# Patient Record
Sex: Male | Born: 1990 | Hispanic: No | Marital: Single | Smoking: Never smoker
Health system: Southern US, Community
[De-identification: ages and names within clinical notes are randomized; demographics above are authoritative.]

---

## 2011-06-15 ENCOUNTER — Inpatient Hospital Stay (HOSPITAL_COMMUNITY)
Admission: EM | Admit: 2011-06-15 | Discharge: 2011-06-17 | DRG: 343 | Disposition: A | Discharge status: Home / Self Care | Payer: Self-pay | Attending: Surgery | Admitting: Surgery

## 2011-06-15 ENCOUNTER — Encounter (HOSPITAL_COMMUNITY): Payer: Self-pay | Admitting: *Deleted

## 2011-06-15 ENCOUNTER — Ambulatory Visit: Payer: Self-pay | Admitting: Emergency Medicine

## 2011-06-15 VITALS — BP 158/110 | HR 82 | Temp 98.3°F | Resp 18 | Ht 67.5 in | Wt 176.6 lb

## 2011-06-15 DIAGNOSIS — M25519 Pain in unspecified shoulder: Secondary | ICD-10-CM | POA: Diagnosis present

## 2011-06-15 DIAGNOSIS — R1032 Left lower quadrant pain: Secondary | ICD-10-CM | POA: Diagnosis present

## 2011-06-15 DIAGNOSIS — K3580 Unspecified acute appendicitis: Principal | ICD-10-CM

## 2011-06-15 DIAGNOSIS — R1011 Right upper quadrant pain: Secondary | ICD-10-CM

## 2011-06-15 LAB — DIFFERENTIAL
Basophils Absolute: 0 10*3/uL (ref 0.0–0.1)
Basophils Relative: 0 % (ref 0–1)
Monocytes Absolute: 0.7 10*3/uL (ref 0.1–1.0)
Neutro Abs: 10 10*3/uL — ABNORMAL HIGH (ref 1.7–7.7)
Neutrophils Relative %: 77 % (ref 43–77)

## 2011-06-15 LAB — POCT CBC
Granulocyte percent: 69.2 %G (ref 37–80)
Hemoglobin: 16.4 g/dL (ref 14.1–18.1)
MCH, POC: 30 pg (ref 27–31.2)
MPV: 13.1 fL (ref 0–99.8)
POC MID %: 5.7 %M (ref 0–12)
Platelet Count, POC: 141 10*3/uL — AB (ref 142–424)
RBC: 5.47 M/uL (ref 4.69–6.13)
WBC: 12.3 10*3/uL — AB (ref 4.6–10.2)

## 2011-06-15 LAB — POCT URINALYSIS DIPSTICK
Bilirubin, UA: NEGATIVE
Blood, UA: NEGATIVE
Glucose, UA: NEGATIVE
Leukocytes, UA: NEGATIVE
Nitrite, UA: NEGATIVE
Urobilinogen, UA: 0.2

## 2011-06-15 LAB — CBC
MCHC: 34.8 g/dL (ref 30.0–36.0)
Platelets: 132 10*3/uL — ABNORMAL LOW (ref 150–400)
RDW: 12.6 % (ref 11.5–15.5)

## 2011-06-15 LAB — POCT UA - MICROSCOPIC ONLY
Bacteria, U Microscopic: NEGATIVE
RBC, urine, microscopic: NEGATIVE
Yeast, UA: NEGATIVE

## 2011-06-15 LAB — BASIC METABOLIC PANEL
CO2: 27 mEq/L (ref 19–32)
Calcium: 9.8 mg/dL (ref 8.4–10.5)
GFR calc Af Amer: >90 mL/min (ref >90)
GFR calc non Af Amer: >90 mL/min (ref >90)
Sodium: 136 mEq/L (ref 135–145)

## 2011-06-15 NOTE — Patient Instructions (Signed)
You have early signs of appendicitis, go straight to [facility] Emergency room  Apendicitis (Appendicitis) Se llama apendicitis a la inflamacin del apndice. La inflamacin puede causar la ruptura (perforacin) y la acumulacin de pus. (absceso).  CAUSAS No siempre hay una causa evidente de apendicitis. A veces se produce por una obstruccin en el apndice. Las causas de la obstruccin pueden ser:  Verner Mould pequea y dura de materia fecal del tamao de una arveja (fecalito).   Ganglios linfticos agrandados en el apndice.  SINTOMAS  Dolor alrededor del ombligo que se irradia hacia la zona derecha del abdomen. El dolor puede ser cada vez ms intenso y agudo a medida que el tiempo pasa.   Sensibilidad en la zona inferior derecha del abdomen. El dolor empeora si tose o realiza algn movimiento brusco.   Ganas de vomitar (nuseas).   Vmitos.   Prdida del apetito.   Grant Ruts.   Constipacin.   Diarrea.   Malestar general.  DIAGNSTICO  Examen fsico.   anlisis de sangre.   Anlisis de [place].   Una radiografa o tomografa computada para confirmar el diagnstico.  TRATAMENTO Una vez que el diagnstico de apendicitis fue realizado, se procede a extirparlo lo antes posible. Este procedimiento se denomina apendicectoma. En una apendicectoma abierta, se realiza un corte (incisin) en la zona inferior derecha del abdomen y se extirpa el apndice. En una apendicectoma laporoscpica, por lo general se realizan 3 incisiones. Se utilizan instrumentos largos y finos y [place] para extirpar el apndice. La mayora de los pacientes vuelve a su casa luego de 24 a 28 horas. En algunas situaciones, el apndice podra estar perforado y se puede haber formado un absceso. El absceso podr [employer] "pared" alrededor que puede verse con una tomografa computarizada. En este caso, se realizar un drenaje del absceso y se lo tratar con medicamentos que matan grmenes (antibiticos). Una vez que el  absceso se ha resuelto, podr ser o no necesario Writer. Document Released: 03/14/2005 Document Revised: 03/03/2011 [facility] Patient Information 2012 [place], [place].

## 2011-06-15 NOTE — Progress Notes (Signed)
  Subjective:    Patient ID: Jeremiah Snyder, male    DOB: 01-08-1991, 21 y.o.   MRN: [id number]  Abdominal Pain This is a new problem. The current episode started yesterday. The onset quality is sudden. The problem occurs constantly. The problem has been gradually improving. The pain is located in the RUQ. The pain is at a severity of 4/10. The pain is moderate. The quality of the pain is aching. Associated symptoms include nausea. Pertinent negatives include no diarrhea, dysuria or vomiting.  Jeremiah Snyder is a 21 year old Hispanic male who is here with an interpreter.  He started having right upper quadrant pain last night.  Through the translator he states the pain was a 7-8.  He had a normal BM yesterday, no pain or blood.  He has had no emesis but complains of anorexia.  His abdominal pain is now a 4-5 and less intense.  He denies constipation.  He has no urinary pain or discomfort.  He denies fever.  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   Review of Systems  Constitutional: Negative.   HENT: Negative.   Eyes: Negative.   Respiratory: Negative.   Cardiovascular: Negative.   Gastrointestinal: Positive for nausea and abdominal pain. Negative for vomiting, diarrhea, blood in stool, abdominal distention, anal bleeding and rectal pain.  Genitourinary: Negative for dysuria, discharge and difficulty urinating.  Musculoskeletal: Negative.   Skin: Negative.   Hematological: Negative.   Psychiatric/Behavioral: Negative.        Objective:   Physical Exam  Vitals reviewed. Constitutional: He appears well-developed and well-nourished. No distress.  HENT:  Head: Normocephalic.  Mouth/Throat: Oropharynx is clear and moist.  Eyes: Conjunctivae are normal.  Neck: Neck supple.  Cardiovascular: Normal rate, regular rhythm and normal heart sounds.   Pulmonary/Chest: Effort normal and breath sounds normal.  Abdominal: Soft. He exhibits no distension. There is tenderness.        Hyperactive bowel sounds.  Tender over RUQ more to deep palpation than when I release.  No tenderness to percussion.  No distinct masses.  Pt very tense on exam and limited secondary to muscular tightness.  Normal femoral pulses, no pelvic adenopathy.  Skin: Skin is warm and dry.  Psychiatric: He has a normal mood and affect. His behavior is normal.          Assessment & Plan:  WBC shows mild elevation with left shift.  Strongly suspective of appendicitis.  Dr. Cleta Alberts seen and examined pt, agrees he need CT scan tonight.  AVS and directions to [facility] given.  Dr. Cleta Alberts spoke with triage nurse.  Pt sent with interpreter to hospital tonight

## 2011-06-16 ENCOUNTER — Encounter (HOSPITAL_COMMUNITY): Payer: Self-pay

## 2011-06-16 ENCOUNTER — Encounter (HOSPITAL_COMMUNITY)
Admission: EM | Disposition: A | Discharge status: Home / Self Care | Payer: Self-pay | Source: Home / Self Care | Attending: Surgery

## 2011-06-16 ENCOUNTER — Encounter (HOSPITAL_COMMUNITY): Payer: Self-pay | Admitting: Registered Nurse

## 2011-06-16 ENCOUNTER — Emergency Department (HOSPITAL_COMMUNITY): Payer: Self-pay

## 2011-06-16 ENCOUNTER — Inpatient Hospital Stay (HOSPITAL_COMMUNITY): Payer: Self-pay | Admitting: Registered Nurse

## 2011-06-16 DIAGNOSIS — K3580 Unspecified acute appendicitis: Secondary | ICD-10-CM

## 2011-06-16 HISTORY — PX: LAPAROSCOPIC APPENDECTOMY: SHX408

## 2011-06-16 LAB — CBC
Hemoglobin: 15.4 g/dL (ref 13.0–17.0)
MCHC: 34.5 g/dL (ref 30.0–36.0)
WBC: 10.5 10*3/uL (ref 4.0–10.5)

## 2011-06-16 LAB — MRSA PCR SCREENING: MRSA by PCR: NEGATIVE

## 2011-06-16 LAB — CREATININE, SERUM
GFR calc Af Amer: >90 mL/min (ref >90)
GFR calc non Af Amer: >90 mL/min (ref >90)

## 2011-06-16 LAB — URINALYSIS, ROUTINE W REFLEX MICROSCOPIC
Leukocytes, UA: NEGATIVE
Nitrite: NEGATIVE
Protein, ur: NEGATIVE mg/dL
Urobilinogen, UA: 0.2 mg/dL (ref 0.0–1.0)

## 2011-06-16 SURGERY — APPENDECTOMY, LAPAROSCOPIC
Anesthesia: General | Wound class: Clean Contaminated

## 2011-06-16 MED ORDER — SODIUM CHLORIDE 0.9 % IV SOLN
1.0000 g | Freq: Once | INTRAVENOUS | Status: AC
  Administered 2011-06-16: 1 g via INTRAVENOUS
  Filled 2011-06-16: qty 1

## 2011-06-16 MED ORDER — PANTOPRAZOLE SODIUM 40 MG IV SOLR
40.0000 mg | Freq: Every day | INTRAVENOUS | Status: DC
  Administered 2011-06-16: 40 mg via INTRAVENOUS
  Filled 2011-06-16 (×3): qty 40

## 2011-06-16 MED ORDER — SODIUM CHLORIDE 0.9 % IV SOLN
Freq: Once | INTRAVENOUS | Status: AC
  Administered 2011-06-16: 1000 mL via INTRAVENOUS

## 2011-06-16 MED ORDER — MIDAZOLAM HCL 5 MG/5ML IJ SOLN
INTRAMUSCULAR | Status: DC | PRN
  Administered 2011-06-16: 2 mg via INTRAVENOUS

## 2011-06-16 MED ORDER — FENTANYL CITRATE 0.05 MG/ML IJ SOLN: 25.0000 ug | INTRAMUSCULAR | Status: DC | PRN

## 2011-06-16 MED ORDER — DIPHENHYDRAMINE HCL 12.5 MG/5ML PO ELIX: 12.5000 mg | ORAL_SOLUTION | Freq: Four times a day (QID) | ORAL | Status: DC | PRN

## 2011-06-16 MED ORDER — PROMETHAZINE HCL 25 MG/ML IJ SOLN: 6.2500 mg | INTRAMUSCULAR | Status: DC | PRN

## 2011-06-16 MED ORDER — BUPIVACAINE-EPINEPHRINE PF 0.25-1:200000 % IJ SOLN
INTRAMUSCULAR | Status: AC
  Filled 2011-06-16: qty 30

## 2011-06-16 MED ORDER — FENTANYL CITRATE 0.05 MG/ML IJ SOLN
INTRAMUSCULAR | Status: DC | PRN
  Administered 2011-06-16 (×2): 50 ug via INTRAVENOUS
  Administered 2011-06-16: 100 ug via INTRAVENOUS

## 2011-06-16 MED ORDER — ONDANSETRON HCL 4 MG/2ML IJ SOLN
4.0000 mg | Freq: Four times a day (QID) | INTRAMUSCULAR | Status: DC | PRN
  Administered 2011-06-16: 4 mg via INTRAVENOUS
  Filled 2011-06-16: qty 2

## 2011-06-16 MED ORDER — DIPHENHYDRAMINE HCL 50 MG/ML IJ SOLN: 12.5000 mg | Freq: Four times a day (QID) | INTRAMUSCULAR | Status: DC | PRN

## 2011-06-16 MED ORDER — DEXAMETHASONE SODIUM PHOSPHATE 10 MG/ML IJ SOLN
INTRAMUSCULAR | Status: DC | PRN
  Administered 2011-06-16: 10 mg via INTRAVENOUS

## 2011-06-16 MED ORDER — SODIUM CHLORIDE 0.9 % IV SOLN
1.0000 g | INTRAVENOUS | Status: DC
  Administered 2011-06-16: 1 g via INTRAVENOUS
  Filled 2011-06-16 (×2): qty 1

## 2011-06-16 MED ORDER — HYDROCODONE-ACETAMINOPHEN 5-325 MG PO TABS
1.0000 | ORAL_TABLET | ORAL | Status: DC | PRN
  Administered 2011-06-17: 2 via ORAL
  Filled 2011-06-16: qty 2
  Filled 2011-06-16: qty 1

## 2011-06-16 MED ORDER — BUPIVACAINE-EPINEPHRINE 0.25% -1:200000 IJ SOLN
INTRAMUSCULAR | Status: DC | PRN
  Administered 2011-06-16: 10 mL

## 2011-06-16 MED ORDER — NEOSTIGMINE METHYLSULFATE 1 MG/ML IJ SOLN
INTRAMUSCULAR | Status: DC | PRN
  Administered 2011-06-16: 4 mg via INTRAVENOUS

## 2011-06-16 MED ORDER — ROCURONIUM BROMIDE 100 MG/10ML IV SOLN
INTRAVENOUS | Status: DC | PRN
  Administered 2011-06-16: 30 mg via INTRAVENOUS

## 2011-06-16 MED ORDER — LACTATED RINGERS IV SOLN
INTRAVENOUS | Status: DC
  Administered 2011-06-16: 1000 mL via INTRAVENOUS

## 2011-06-16 MED ORDER — IOHEXOL 300 MG/ML  SOLN
100.0000 mL | Freq: Once | INTRAMUSCULAR | Status: AC | PRN
  Administered 2011-06-16: 100 mL via INTRAVENOUS

## 2011-06-16 MED ORDER — SUCCINYLCHOLINE CHLORIDE 20 MG/ML IJ SOLN
INTRAMUSCULAR | Status: DC | PRN
  Administered 2011-06-16: 100 mg via INTRAVENOUS

## 2011-06-16 MED ORDER — KCL IN DEXTROSE-NACL 20-5-0.45 MEQ/L-%-% IV SOLN
INTRAVENOUS | Status: DC
  Filled 2011-06-16 (×2): qty 1000

## 2011-06-16 MED ORDER — HEPARIN SODIUM (PORCINE) 5000 UNIT/ML IJ SOLN: 5000.0000 [IU] | Freq: Three times a day (TID) | INTRAMUSCULAR | Status: DC

## 2011-06-16 MED ORDER — MORPHINE SULFATE 2 MG/ML IJ SOLN
2.0000 mg | INTRAMUSCULAR | Status: DC | PRN
  Administered 2011-06-16 (×2): 2 mg via INTRAVENOUS
  Filled 2011-06-16 (×3): qty 1

## 2011-06-16 MED ORDER — HEPARIN SODIUM (PORCINE) 5000 UNIT/ML IJ SOLN
5000.0000 [IU] | Freq: Three times a day (TID) | INTRAMUSCULAR | Status: DC
  Administered 2011-06-16 – 2011-06-17 (×3): 5000 [IU] via SUBCUTANEOUS
  Filled 2011-06-16 (×8): qty 1

## 2011-06-16 MED ORDER — GLYCOPYRROLATE 0.2 MG/ML IJ SOLN
INTRAMUSCULAR | Status: DC | PRN
  Administered 2011-06-16: .7 mg via INTRAVENOUS

## 2011-06-16 MED ORDER — ONDANSETRON HCL 4 MG/2ML IJ SOLN
4.0000 mg | Freq: Once | INTRAMUSCULAR | Status: AC
  Administered 2011-06-16: 4 mg via INTRAVENOUS
  Filled 2011-06-16: qty 2

## 2011-06-16 MED ORDER — PROPOFOL 10 MG/ML IV EMUL
INTRAVENOUS | Status: DC | PRN
  Administered 2011-06-16: 170 mg via INTRAVENOUS

## 2011-06-16 MED ORDER — KCL IN DEXTROSE-NACL 20-5-0.45 MEQ/L-%-% IV SOLN
INTRAVENOUS | Status: DC
  Administered 2011-06-16 – 2011-06-17 (×2): via INTRAVENOUS
  Filled 2011-06-16 (×4): qty 1000

## 2011-06-16 MED ORDER — MORPHINE SULFATE 4 MG/ML IJ SOLN
4.0000 mg | Freq: Once | INTRAMUSCULAR | Status: AC
  Administered 2011-06-16: 4 mg via INTRAVENOUS
  Filled 2011-06-16: qty 1

## 2011-06-16 MED ORDER — LIDOCAINE HCL (CARDIAC) 20 MG/ML IV SOLN
INTRAVENOUS | Status: DC | PRN
  Administered 2011-06-16: 50 mg via INTRAVENOUS

## 2011-06-16 MED ORDER — LACTATED RINGERS IV SOLN
INTRAVENOUS | Status: DC | PRN
  Administered 2011-06-16: 1000 mL via INTRAVENOUS

## 2011-06-16 SURGICAL SUPPLY — 41 items
APPLIER CLIP ROT 10 11.4 M/L (STAPLE)
BENZOIN TINCTURE PRP APPL 2/3 (GAUZE/BANDAGES/DRESSINGS) ×4 IMPLANT
CANISTER SUCTION 2500CC (MISCELLANEOUS) ×2 IMPLANT
CATH FOLEY 2WAY SLVR  5CC 14FR (CATHETERS) ×1
CATH FOLEY 2WAY SLVR 5CC 14FR (CATHETERS) ×1 IMPLANT
CLIP APPLIE ROT 10 11.4 M/L (STAPLE) IMPLANT
CLOTH BEACON ORANGE TIMEOUT ST (SAFETY) ×2 IMPLANT
COVER SURGICAL LIGHT HANDLE (MISCELLANEOUS) ×2 IMPLANT
CUTTER FLEX LINEAR 45M (STAPLE) ×2 IMPLANT
DECANTER SPIKE VIAL GLASS SM (MISCELLANEOUS) IMPLANT
DRAPE LAPAROSCOPIC ABDOMINAL (DRAPES) ×2 IMPLANT
ELECT REM PT RETURN 9FT ADLT (ELECTROSURGICAL) ×2
ELECTRODE REM PT RTRN 9FT ADLT (ELECTROSURGICAL) ×1 IMPLANT
ENDOLOOP SUT PDS II  0 18 (SUTURE)
ENDOLOOP SUT PDS II 0 18 (SUTURE) IMPLANT
GLOVE BIOGEL M 8.0 STRL (GLOVE) ×2 IMPLANT
GLOVE BIOGEL PI IND STRL 7.0 (GLOVE) ×1 IMPLANT
GLOVE BIOGEL PI INDICATOR 7.0 (GLOVE) ×1
GLOVE SURG SS PI 7.0 STRL IVOR (GLOVE) ×2 IMPLANT
GOWN STRL NON-REIN LRG LVL3 (GOWN DISPOSABLE) ×2 IMPLANT
GOWN STRL REIN XL XLG (GOWN DISPOSABLE) ×4 IMPLANT
HAND ACTIVATED (MISCELLANEOUS) IMPLANT
KIT BASIN OR (CUSTOM PROCEDURE TRAY) ×2 IMPLANT
NS IRRIG 1000ML POUR BTL (IV SOLUTION) ×2 IMPLANT
PENCIL BUTTON HOLSTER BLD 10FT (ELECTRODE) IMPLANT
POUCH SPECIMEN RETRIEVAL 10MM (ENDOMECHANICALS) ×2 IMPLANT
RELOAD 45 VASCULAR/THIN (ENDOMECHANICALS) ×2 IMPLANT
RELOAD STAPLE TA45 3.5 REG BLU (ENDOMECHANICALS) IMPLANT
SCALPEL HARMONIC ACE (MISCELLANEOUS) ×2 IMPLANT
SET IRRIG TUBING LAPAROSCOPIC (IRRIGATION / IRRIGATOR) ×2 IMPLANT
SOLUTION ANTI FOG 6CC (MISCELLANEOUS) ×2 IMPLANT
STRIP CLOSURE SKIN 1/2X4 (GAUZE/BANDAGES/DRESSINGS) ×2 IMPLANT
SUT VIC AB 4-0 SH 18 (SUTURE) ×2 IMPLANT
SUT VICRYL 0 UR6 27IN ABS (SUTURE) ×4 IMPLANT
SYR 30ML LL (SYRINGE) ×2 IMPLANT
TRAY FOLEY CATH 14FRSI W/METER (CATHETERS) ×2 IMPLANT
TRAY LAP CHOLE (CUSTOM PROCEDURE TRAY) ×2 IMPLANT
TROCAR BLADELESS OPT 5 75 (ENDOMECHANICALS) ×2 IMPLANT
TROCAR XCEL BLUNT TIP 100MML (ENDOMECHANICALS) ×2 IMPLANT
TROCAR XCEL NON-BLD 11X100MML (ENDOMECHANICALS) ×2 IMPLANT
TUBING INSUFFLATION 10FT LAP (TUBING) ×2 IMPLANT

## 2011-06-16 NOTE — H&P (Signed)
Chief Complaint:  Right lower quadrant abdominal pain since last night  History of Present Illness:  Jeremiah Snyder is an 21 y.o. male who comes with a friend who can speak English who presents with with a 12 hour history of abdominal pain. The pain is localized in his right lower quadrant. He has not had nausea vomiting or diarrhea. On CT scan he was found to have a retrocecal appendicitis with a diameter of about 1.2 cm in his appendix with evidence of appendicitis.  Past medical history no known allergies. No prior surgery. Takes no medications.  History reviewed. No pertinent past medical history.  History reviewed. No pertinent past surgical history.  Current Facility-Administered Medications  Medication Dose Route Frequency Provider Last Rate Last Dose  . 0.9 %  sodium chloride infusion   Intravenous Once Geoffery Lyons, MD 1,000 mL/hr at 06/16/11 0329 1,000 mL at 06/16/11 0329  . ertapenem (INVANZ) 1 g in sodium chloride 0.9 % 50 mL IVPB  1 g Intravenous Once Geoffery Lyons, MD   1 g at 06/16/11 0655  . iohexol (OMNIPAQUE) 300 MG/ML solution 100 mL  100 mL Intravenous Once PRN Medication Radiologist, MD   100 mL at 06/16/11 0539  . morphine 4 MG/ML injection 4 mg  4 mg Intravenous Once Geoffery Lyons, MD   4 mg at 06/16/11 0330  . ondansetron (ZOFRAN) injection 4 mg  4 mg Intravenous Once Geoffery Lyons, MD   4 mg at 06/16/11 0330   Current Outpatient Prescriptions  Medication Sig Dispense Refill  . bismuth subsalicylate (PEPTO BISMOL) 262 MG/15ML suspension Take 15 mLs by mouth every 6 (six) hours as needed. Upset stomach.       Review of patient's allergies indicates no known allergies. History reviewed. No pertinent family history. Social History:   reports that he has never smoked. He has never used smokeless tobacco. He reports that he drinks alcohol. He reports that he does not use illicit drugs.   REVIEW OF SYSTEMS - PERTINENT POSITIVES ONLY: As described elsewhere in the history  of present illness.  Physical Exam:   Blood pressure 147/69, pulse 77, temperature 98.3 F (36.8 C), temperature source Oral, resp. rate 16, weight 176 lb (79.833 kg), SpO2 100.00%. There is no height on file to calculate BMI.  Gen:  WDWN Hispanic male NAD  Neurological: Alert and oriented to person, place, and time. Motor and sensory function is grossly intact  Head: Normocephalic and atraumatic.  Eyes: Conjunctivae are normal. Pupils are equal, round, and reactive to light. No scleral icterus.  Neck: Normal range of motion. Neck supple. No tracheal deviation or thyromegaly present.  Cardiovascular:  SR without murmurs or gallops.  No carotid bruits Respiratory: Effort normal.  No respiratory distress. No chest wall tenderness. Breath sounds normal.  No wheezes, rales or rhonchi.  Abdomen:  Point tenderness lateral to McBurney's point and consistent with a retrocecal appendicitis. GU: Musculoskeletal: Normal range of motion. Extremities are nontender. No cyanosis, edema or clubbing noted Lymphadenopathy: No cervical, preauricular, postauricular or axillary adenopathy is present Skin: Skin is warm and dry. No rash noted. No diaphoresis. No erythema. No pallor. Pscyh: Normal mood and affect. Behavior is normal. Judgment and thought content normal.   LABORATORY RESULTS: Results for orders placed during the hospital encounter of 06/15/11 (from the past 48 hour(s))  CBC     Status: Abnormal   Collection Time   06/15/11 10:22 PM      Component Value Range Comment   WBC  12.9 (*) 4.0 - 10.5 (K/uL)    RBC 5.60  4.22 - 5.81 (MIL/uL)    Hemoglobin 16.8  13.0 - 17.0 (g/dL)    HCT 25.9  56.3 - 87.5 (%)    MCV 86.3  78.0 - 100.0 (fL)    MCH 30.0  26.0 - 34.0 (pg)    MCHC 34.8  30.0 - 36.0 (g/dL)    RDW 64.3  32.9 - 51.8 (%)    Platelets 132 (*) 150 - 400 (K/uL)   DIFFERENTIAL     Status: Abnormal   Collection Time   06/15/11 10:22 PM      Component Value Range Comment   Neutrophils Relative  77  43 - 77 (%)    Neutro Abs 10.0 (*) 1.7 - 7.7 (K/uL)    Lymphocytes Relative 17  12 - 46 (%)    Lymphs Abs 2.2  0.7 - 4.0 (K/uL)    Monocytes Relative 6  3 - 12 (%)    Monocytes Absolute 0.7  0.1 - 1.0 (K/uL)    Eosinophils Relative 0  0 - 5 (%)    Eosinophils Absolute 0.0  0.0 - 0.7 (K/uL)    Basophils Relative 0  0 - 1 (%)    Basophils Absolute 0.0  0.0 - 0.1 (K/uL)   LIPASE, BLOOD     Status: Normal   Collection Time   06/15/11 10:22 PM      Component Value Range Comment   Lipase 18  11 - 59 (U/L)   BASIC METABOLIC PANEL     Status: Normal   Collection Time   06/15/11 10:22 PM      Component Value Range Comment   Sodium 136  135 - 145 (mEq/L)    Potassium 3.5  3.5 - 5.1 (mEq/L)    Chloride 98  96 - 112 (mEq/L)    CO2 27  19 - 32 (mEq/L)    Glucose, Bld 96  70 - 99 (mg/dL)    BUN 12  6 - 23 (mg/dL)    Creatinine, Ser 8.41  0.50 - 1.35 (mg/dL)    Calcium 9.8  8.4 - 10.5 (mg/dL)    GFR calc non Af Amer >90  >90 (mL/min)    GFR calc Af Amer >90  >90 (mL/min)   URINALYSIS, ROUTINE W REFLEX MICROSCOPIC     Status: Abnormal   Collection Time   06/16/11  5:52 AM      Component Value Range Comment   Color, Urine YELLOW  YELLOW     APPearance CLEAR  CLEAR     Specific Gravity, Urine 1.029  1.005 - 1.030     pH 6.5  5.0 - 8.0     Glucose, UA NEGATIVE  NEGATIVE (mg/dL)    Hgb urine dipstick NEGATIVE  NEGATIVE     Bilirubin Urine NEGATIVE  NEGATIVE     Ketones, ur TRACE (*) NEGATIVE (mg/dL)    Protein, ur NEGATIVE  NEGATIVE (mg/dL)    Urobilinogen, UA 0.2  0.0 - 1.0 (mg/dL)    Nitrite NEGATIVE  NEGATIVE     Leukocytes, UA NEGATIVE  NEGATIVE  MICROSCOPIC NOT DONE ON URINES WITH NEGATIVE PROTEIN, BLOOD, LEUKOCYTES, NITRITE, OR GLUCOSE <1000 mg/dL.    RADIOLOGY RESULTS: Ct Abdomen Pelvis W Contrast  06/16/2011  *RADIOLOGY REPORT*  Clinical Data: Right sided abdominal pain, nausea and leukocytosis.  CT ABDOMEN AND PELVIS WITH CONTRAST  Technique:  Multidetector CT imaging of the  abdomen and pelvis was performed following the  standard protocol during bolus administration of intravenous contrast.  Contrast: OMNIPAQUE IOHEXOL 300 MG/ML IJ SOLN  Comparison: None.  Findings: The visualized lung bases are clear.  The liver and spleen are unremarkable in appearance.  The gallbladder is within normal limits.  The pancreas and adrenal glands are unremarkable.  The kidneys are unremarkable in appearance.  There is no evidence of hydronephrosis.  No renal or ureteral stones are seen.  No perinephric stranding is appreciated.  No free fluid is identified.  The small bowel is unremarkable in appearance.  The stomach is within normal limits.  No acute vascular abnormalities are seen.  The appendix is diffusely dilated, measuring 1.2 cm in diameter, with wall thickening, and surrounding soft tissue stranding and trace fluid.  A retrocecal appendix is noted.  Associated prominent pericecal lymph nodes are appreciated.  Findings compatible with acute appendicitis.  The colon is filled with contrast and is unremarkable in appearance.  The bladder is moderately distended and grossly unremarkable in appearance.  A small urachal remnant is incidentally seen.  The prostate remains normal in size.  No inguinal lymphadenopathy is seen; inguinal nodes remain borderline normal in size.  No acute osseous abnormalities are identified.  IMPRESSION: Acute appendicitis noted, with dilatation of the appendix to 1.2 cm, wall thickening, and surrounding soft tissue stranding and trace fluid.  Prominent pericecal nodes seen.  The appendix is retrocecal in nature.  No evidence of perforation or abscess formation at this time.  Original Report Authenticated By: Tonia Ghent, M.D.    Problem List: Patient Active Problem List  Diagnoses  . Appendicitis, acute    Assessment & Plan: Retro-cecal appendicitis. Plan laparoscopic or open appendectomy. I have discussed this with the patient with the assistance of his  interpreter and he understands the indications for the procedure and the risks. We'll proceed with appendectomy later today.    Matt B. Daphine Deutscher, MD, [facility] [phone] beeper [phone]  06/16/2011 8:20 AM

## 2011-06-16 NOTE — ED Provider Notes (Signed)
History     CSN: [id number]  Arrival date & time 06/15/11  2044   First MD Initiated Contact with Patient 06/16/11 0253      Chief Complaint  Patient presents with  . Abdominal Pain    pt c/o RLQ abd pain sent from [place] due to increased WBC.     (Consider location/radiation/quality/duration/timing/severity/associated sxs/prior treatment) Patient is a 21 y.o. male presenting with abdominal pain. The history is provided by the patient.  Abdominal Pain The primary symptoms of the illness include abdominal pain. The primary symptoms of the illness do not include fever, nausea, vomiting or diarrhea. The current episode started yesterday. The onset of the illness was gradual. The problem has been gradually worsening.  The patient has not had a change in bowel habit. Symptoms associated with the illness do not include chills, urgency or back pain.    History reviewed. No pertinent past medical history.  History reviewed. No pertinent past surgical history.  History reviewed. No pertinent family history.  History  Substance Use Topics  . Smoking status: Never Smoker   . Smokeless tobacco: Never Used  . Alcohol Use: Yes     sOCIAL      Review of Systems  Constitutional: Negative for fever and chills.  Gastrointestinal: Positive for abdominal pain. Negative for nausea, vomiting and diarrhea.  Genitourinary: Negative for urgency.  Musculoskeletal: Negative for back pain.  All other systems reviewed and are negative.    Allergies  Review of patient's allergies indicates no known allergies.  Home Medications   Current Outpatient Rx  Name Route Sig Dispense Refill  . BISMUTH SUBSALICYLATE 262 MG/15ML PO SUSP Oral Take 15 mLs by mouth every 6 (six) hours as needed. Upset stomach.      BP 155/89  Pulse 85  Temp(Src) 98.3 F (36.8 C) (Oral)  Resp 18  Wt 176 lb (79.833 kg)  SpO2 100%  Physical Exam  Nursing note and vitals reviewed. Constitutional: He is oriented to  person, place, and time. He appears well-developed and well-nourished. No distress.  HENT:  Head: Normocephalic and atraumatic.  Neck: Normal range of motion. Neck supple.  Cardiovascular: Normal rate and regular rhythm.   Pulmonary/Chest: Effort normal and breath sounds normal. No respiratory distress.  Abdominal: Soft. Bowel sounds are normal.       There is ttp in the right mid abdomen.  No rebound or guarding.    Musculoskeletal: Normal range of motion. He exhibits no edema.  Neurological: He is alert and oriented to person, place, and time.  Skin: Skin is warm and dry. He is not diaphoretic.    ED Course  Procedures (including critical care time)  Labs Reviewed  CBC - Abnormal; Notable for the following:    WBC 12.9 (*)    Platelets 132 (*)    All other components within normal limits  DIFFERENTIAL - Abnormal; Notable for the following:    Neutro Abs 10.0 (*)    All other components within normal limits  LIPASE, BLOOD  BASIC METABOLIC PANEL  URINALYSIS, ROUTINE W REFLEX MICROSCOPIC   No results found.   No diagnosis found.    MDM  The ct is diagnostic of acute appendicitis.  Will consult general surgery.        Geoffery Lyons, MD 06/16/11 1534

## 2011-06-16 NOTE — ED Notes (Signed)
Pt started drinking PO CT contrast

## 2011-06-16 NOTE — Anesthesia Preprocedure Evaluation (Signed)
Anesthesia Evaluation  Patient identified by MRN, date of birth, ID band Patient awake    Reviewed: Allergy & Precautions, H&P , NPO status , Patient's Chart, lab work & pertinent test results  Airway Mallampati: II TM Distance: >3 FB Neck ROM: Full    Dental No notable dental hx.    Pulmonary neg pulmonary ROS,  breath sounds clear to auscultation  Pulmonary exam normal       Cardiovascular negative cardio ROS  Rhythm:Regular Rate:Normal     Neuro/Psych negative neurological ROS  negative psych ROS   GI/Hepatic negative GI ROS, Neg liver ROS,   Endo/Other  negative endocrine ROS  Renal/GU negative Renal ROS  negative genitourinary   Musculoskeletal negative musculoskeletal ROS (+)   Abdominal   Peds negative pediatric ROS (+)  Hematology negative hematology ROS (+)   Anesthesia Other Findings   Reproductive/Obstetrics negative OB ROS                           Anesthesia Physical Anesthesia Plan  ASA: I and Emergent  Anesthesia Plan: General   Post-op Pain Management:    Induction: Intravenous and Rapid sequence  Airway Management Planned: Oral ETT  Additional Equipment:   Intra-op Plan:   Post-operative Plan: Extubation in OR  Informed Consent: I have reviewed the patients History and Physical, chart, labs and discussed the procedure including the risks, benefits and alternatives for the proposed anesthesia with the patient or authorized representative who has indicated his/her understanding and acceptance.   Dental advisory given  Plan Discussed with: CRNA  Anesthesia Plan Comments:         Anesthesia Quick Evaluation

## 2011-06-16 NOTE — ED Notes (Signed)
Patient transported to CT 

## 2011-06-16 NOTE — Transfer of Care (Signed)
Immediate Anesthesia Transfer of Care Note  Patient: Jeremiah Snyder  Procedure(s) Performed: Procedure(s) (LRB): APPENDECTOMY LAPAROSCOPIC (N/A)  Patient Location: PACU  Anesthesia Type: General  Level of Consciousness: sedated, patient cooperative and responds to stimulaton  Airway & Oxygen Therapy: Patient Spontanous Breathing and Patient connected to face mask oxgen  Post-op Assessment: Report given to PACU RN and Post -op Vital signs reviewed and stable  Post vital signs: Reviewed and stable  Complications: No apparent anesthesia complications

## 2011-06-16 NOTE — Op Note (Signed)
Surgeon: Wenda Low, MD, FACS  Asst:  None  Anes:  General  Procedure: Laparoscopic appendectomy  Diagnosis: Acute retrocecal appendicitis  Complications: None  EBL:   Minimal cc  Description of Procedure:  [name] was taken to room 6 and given general anesthesia. Preoperatively he received Invanz. After general anesthesia was administered and a timeout was performed the abdomen was prepped with PCMX and draped sterilely. Access to the abdomen was achieved through the umbilicus using Hassan technique without difficulty insufflating the abdomen. Tooth other trocar sites were used including a 5 mm in the right upper quadrant and an 11 mm  laterally in the left lower quadrant. The cecum was rolled and found the stump of the appendix at the base of the cecum. The appendix came off and went laterally and retrocecally. I dissected the base of the appendix and cut under it with an Endo GIA stapler using a white cartridge. The appendix was amputated at that level at the base of the cecum. The mesentery the appendix was then dissected off and transected with the harmonic scalpel. The appendix was removed in toto and placed in a bag. The wall this area was inspected and no bleeding was noted. The appendix was placed in a bag and brought to the umbilicus. The umbilical defect was repaired with a figure-of-eight suture of 0 Vicryl. Marcaine was injected in all the incisions at the fascial level. The abdomen was deflated and wounds were closed with 4-0 Vicryl benzoin and Steri-Strips. Patient are the procedure well taken recovery in satisfactory condition.  Jeremiah B. Daphine Deutscher, MD, Jeremiah Snyder, [place] [phone]

## 2011-06-16 NOTE — Anesthesia Postprocedure Evaluation (Signed)
  Anesthesia Post-op Note  Patient: Jeremiah Snyder  Procedure(s) Performed: Procedure(s) (LRB): APPENDECTOMY LAPAROSCOPIC (N/A)  Patient Location: PACU  Anesthesia Type: General  Level of Consciousness: awake and alert   Airway and Oxygen Therapy: Patient Spontanous Breathing  Post-op Pain: mild  Post-op Assessment: Post-op Vital signs reviewed, Patient's Cardiovascular Status Stable, Respiratory Function Stable, Patent Airway and No signs of Nausea or vomiting  Post-op Vital Signs: stable  Complications: No apparent anesthesia complications

## 2011-06-17 ENCOUNTER — Inpatient Hospital Stay (HOSPITAL_COMMUNITY): Payer: Self-pay

## 2011-06-17 MED ORDER — ACETAMINOPHEN 325 MG PO TABS: 650.0000 mg | ORAL_TABLET | Freq: Four times a day (QID) | ORAL | Status: DC | PRN

## 2011-06-17 MED ORDER — HYDROCODONE-ACETAMINOPHEN 5-325 MG PO TABS: 1.0000 | ORAL_TABLET | ORAL | Status: AC | PRN

## 2011-06-17 MED ORDER — IBUPROFEN 200 MG PO TABS: 600.0000 mg | ORAL_TABLET | Freq: Four times a day (QID) | ORAL | Status: AC | PRN

## 2011-06-17 MED ORDER — IBUPROFEN 200 MG PO TABS: 600.0000 mg | ORAL_TABLET | Freq: Four times a day (QID) | ORAL | Status: DC | PRN

## 2011-06-17 MED ORDER — IBUPROFEN 600 MG PO TABS
600.0000 mg | ORAL_TABLET | Freq: Four times a day (QID) | ORAL | Status: DC | PRN
  Filled 2011-06-17: qty 1

## 2011-06-17 NOTE — Progress Notes (Signed)
1 Day Post-Op  Subjective: Complains of shoulder pain. And abdominal pain.  Speaks no English.  Objective: Vital signs in last 24 hours: Temp:  [97.6 F (36.4 C)-99.6 F (37.6 C)] 98 F (36.7 C) (03/22 0600) Pulse Rate:  [74-98] 74  (03/22 0600) Resp:  [9-18] 18  (03/22 0600) BP: (119-166)/(68-80) 119/68 mmHg (03/22 0600) SpO2:  [96 %-100 %] 98 % (03/22 0600) Weight:  [80.105 kg (176 lb 9.6 oz)] 80.105 kg (176 lb 9.6 oz) (03/21 1520) Last BM Date: 06/15/11  Intake/Output from previous day: 03/21 0701 - 03/22 0700 In: 2710 [P.O.:720; I.V.:1990] Out: 2900 [Urine:2900] Intake/Output this shift:    General appearance: alert, cooperative and mild distress GI: soft, non-tender; bowel sounds normal; no masses,  no organomegaly and c/o pain and tenderness with abdomen.  +flatus, no nausea.   Extremities: L shoulder pain, No pain with ROM.    Lab Results:   [facility] 06/16/11 1615 06/15/11 2222  WBC 10.5 12.9*  HGB 15.4 16.8  HCT 44.7 48.3  PLT 125* 132*    BMET  Basename 06/16/11 1615 06/15/11 2222  NA -- 136  K -- 3.5  CL -- 98  CO2 -- 27  GLUCOSE -- 96  BUN -- 12  CREATININE 1.01 0.96  CALCIUM -- 9.8   PT/INR No results found for this basename: LABPROT:2,INR:2 in the last 72 hours  No results found for this basename: AST:5,ALT:5,ALKPHOS:5,BILITOT:5,PROT:5,ALBUMIN:5 in the last 168 hours   Lipase     Component Value Date/Time   LIPASE 18 06/15/2011 2222     Studies/Results: Ct Abdomen Pelvis W Contrast  06/16/2011  *RADIOLOGY REPORT*  Clinical Data: Right sided abdominal pain, nausea and leukocytosis.  CT ABDOMEN AND PELVIS WITH CONTRAST  Technique:  Multidetector CT imaging of the abdomen and pelvis was performed following the standard protocol during bolus administration of intravenous contrast.  Contrast: OMNIPAQUE IOHEXOL 300 MG/ML IJ SOLN  Comparison: None.  Findings: The visualized lung bases are clear.  The liver and spleen are unremarkable in  appearance.  The gallbladder is within normal limits.  The pancreas and adrenal glands are unremarkable.  The kidneys are unremarkable in appearance.  There is no evidence of hydronephrosis.  No renal or ureteral stones are seen.  No perinephric stranding is appreciated.  No free fluid is identified.  The small bowel is unremarkable in appearance.  The stomach is within normal limits.  No acute vascular abnormalities are seen.  The appendix is diffusely dilated, measuring 1.2 cm in diameter, with wall thickening, and surrounding soft tissue stranding and trace fluid.  A retrocecal appendix is noted.  Associated prominent pericecal lymph nodes are appreciated.  Findings compatible with acute appendicitis.  The colon is filled with contrast and is unremarkable in appearance.  The bladder is moderately distended and grossly unremarkable in appearance.  A small urachal remnant is incidentally seen.  The prostate remains normal in size.  No inguinal lymphadenopathy is seen; inguinal nodes remain borderline normal in size.  No acute osseous abnormalities are identified.  IMPRESSION: Acute appendicitis noted, with dilatation of the appendix to 1.2 cm, wall thickening, and surrounding soft tissue stranding and trace fluid.  Prominent pericecal nodes seen.  The appendix is retrocecal in nature.  No evidence of perforation or abscess formation at this time.  Original Report Authenticated By: Tonia Ghent, M.D.    Medications:    . ertapenem (INVANZ) IV  1 g Intravenous Q24H  . heparin  5,000 Units Subcutaneous Q8H  .  pantoprazole (PROTONIX) IV  40 mg Intravenous QHS  . DISCONTD: heparin  5,000 Units Subcutaneous Q8H    Assessment/Plan  Acute retrocecal appendicitis s/p 06/16/11 L shoulder pain, Plan: PA of chest and Right shoulder., advance diet.  See how he does   LOS: 2 days    Lamia Mariner 06/17/2011

## 2011-06-17 NOTE — Discharge Instructions (Signed)
Apendectoma Laparoscpica (Laparoscopic Appendectomy)  Neomia Dear apendicectoma es un procedimiento que se realiza para extirpar el apndice. La ciruga laparoscpica emplea varios cortes pequeos (incisiones) en lugar de una incisin grande. La ciruga laparoscpica brinda un tiempo ms breve de recuperacin y [employer].  INFORME A SU MDICO:   Alergias a alimentos o medicamentos.   Medicamentos que [place], incluyendo vitaminas, hierbas, gotas oftlmicas, medicamentos de venta libre y cremas.   Uso de corticoides (por va oral o cremas).   Problemas anteriores debido a anestsicos o a medicamentos que [employer] sensibilidad.   Antecedentes de hemorragias o cogulos sanguneos   Cirugas anteriores.   Otros problemas de salud, incluyendo diabetes, problemas cardacos, pulmonares y renales.   Posibilidad de embarazo, si corresponde.  RIESGOS Y COMPLICACIONES.   Infecciones. Un germen comienza a desarrollarse en la herida. Generalmente se trata con antibiticos. En algunos casos, la herida debe abrirse y limpiarse.   Hemorragias   Lesiones en los rganos circundantes.   lceras (abscesos).   Dolor crnico en el sitio de la incisin. Se define como un dolor que dura ms de 3 meses.   Cogulos de [employer] piernas que en algunos casos [employer].   Infecciones en el pulmn (neumona).  ANTES DEL PROCEDIMIENTO  La apendicectoma se realiza inmediatamente despus de diagnosticar la inflamacin del apndice (apendicitis). No es necesario una preparacin antes del procedimiento.  PROCEDIMIENTO   Le administrarn un medicamento que lo har dormir (anestesia general). Una vez que se encuentre dormido, Musician un tubo flexible (catter) en la vejiga para drenar la orina durante la ciruga. El tubo se retira antes de que usted se despierte de la anestesia. Cuando se encuentre dormido, le insuflarn dixido de carbono en el abdomen. Esto le permitir al cirujano  observar el interior del abdomen y llevar a cabo la [place]. Le practicarn varias incisiones pequeas en el abdomen. El cirujano insertar un tubo delgado y luminoso (laparoscopio) a travs de una de las incisiones. El cirujano observar por el laparoscopio mientras realiza la [place]. Se insertarn otros instrumentos quirrgicos a travs de las otras incisiones. La laparoscopia puede no ser apropiada cuando:   Hay una cicatriz importante de una operacin anterior.   El paciente tiene trastornos hemorrgicos.   Si tiene Engineer, petroleum trmino.   Hay otros problemas que hacen imposible el procedimiento laparoscpico, como en caso de una infeccin avanzada o la ruptura del apndice.  Si el cirujano estima que no es seguro seguir con el procedimiento laparoscpico cambiar a una ciruga de abdomen abierto. Esto le dar Neomia Dear mayor visin y campo para [employer]. La ciruga abierta requiere un tiempo ms prolongado de recuperacin. Luego de extirpar el apndice, le cerrarn las incisiones con puntos (suturas) o con un adhesivo para la piel.  DESPUS DEL PROCEDIMIENTO  Lo trasladarn una sala de recuperacin. Cuando el efecto de la anestesia haya desaparecido, lo llevarn a su habitacin en el hospital. Tia Alert del procedimiento le administrarn medicamentos para el dolor para que se sienta confortable. Pregunte a su mdico cunto tiempo estar en el hospital.  Document Released: 04/03/2007 Document Revised: 03/03/2011 [facility] Patient Information 2012 [place], [place].CCS ______[facility] LAPAROSCOPIC SURGERY: POST OP INSTRUCTIONS Always review your discharge instruction sheet given to you by the facility where your surgery was performed. IF YOU HAVE DISABILITY OR FAMILY LEAVE FORMS, YOU MUST BRING THEM TO THE OFFICE FOR PROCESSING.   DO NOT GIVE THEM TO YOUR DOCTOR.  1. A prescription for  pain medication may be given to you upon discharge.  Take your pain medication as prescribed, if  needed.  If narcotic pain medicine is not needed, then you may take acetaminophen (Tylenol) or ibuprofen (Advil) as needed. 2. Take your usually prescribed medications unless otherwise directed. 3. If you need a refill on your pain medication, please contact your pharmacy.  They will contact our office to request authorization. Prescriptions will not be filled after 5pm or on week-ends. 4. You should follow a light diet the first few days after arrival home, such as soup and crackers, etc.  Be sure to include lots of fluids daily. 5. Most patients will experience some swelling and bruising in the area of the incisions.  Ice packs will help.  Swelling and bruising can take several days to resolve.  6. It is common to experience some constipation if taking pain medication after surgery.  Increasing fluid intake and taking a stool softener (such as Colace) will usually help or prevent this problem from occurring.  A mild laxative (Milk of Magnesia or Miralax) should be taken according to package instructions if there are no bowel movements after 48 hours. 7. Unless discharge instructions indicate otherwise, you may remove your bandages 24-48 hours after surgery, and you may shower at that time.  You may have steri-strips (small skin tapes) in place directly over the incision.  These strips should be left on the skin for 7-10 days.  If your surgeon used skin glue on the incision, you may shower in 24 hours.  The glue will flake off over the next 2-3 weeks.  Any sutures or staples will be removed at the office during your follow-up visit. 8. ACTIVITIES:  You may resume regular (light) daily activities beginning the next day--such as daily self-care, walking, climbing stairs--gradually increasing activities as tolerated.  You may have sexual intercourse when it is comfortable.  Refrain from any heavy lifting or straining until approved by your doctor. a. You may drive when you are no longer taking prescription  pain medication, you can comfortably wear a seatbelt, and you can safely maneuver your car and apply brakes. b. RETURN TO WORK:  __________________________________________________________ 9. You should see your doctor in the office for a follow-up appointment approximately 2-3 weeks after your surgery.  Make sure that you call for this appointment within a day or two after you arrive home to insure a convenient appointment time. 10. OTHER INSTRUCTIONS: __________________________________________________________________________________________________________________________ __________________________________________________________________________________________________________________________ WHEN TO CALL YOUR DOCTOR: 1. Fever over 101.0 2. Inability to urinate 3. Continued bleeding from incision. 4. Increased pain, redness, or drainage from the incision. 5. Increasing abdominal pain  The clinic staff is available to answer your questions during regular business hours.  Please don't hesitate to call and ask to speak to one of the nurses for clinical concerns.  If you have a medical emergency, go to the nearest emergency room or call 911.  A surgeon from [facility] is always on call at the hospital. [address], [place], [place]  [address] ? [address] [address], [place], [place]   [address] [phone] ? [phone] ? FAX [phone] Web site: [email or web address]

## 2011-06-17 NOTE — Discharge Summary (Signed)
Physician Discharge Summary  Patient ID: Jeremiah Snyder MRN: [id number] DOB/AGE: 10/21/1990 21 y.o.  Admit date: 06/15/2011 Discharge date: 06/17/2011  Admission Diagnoses: Acute appendicitis Discharge Diagnoses: Same Active Problems:  Appendicitis, acute   PROCEDURES: Laparoscopic appendectomy for Acute retrocecal appendicitis.  DR. Daphine Deutscher 05/19/11  Hospital Course:Jeremiah Snyder is an 21 y.o. male who comes with a friend who can speak English who presents with with a 12 hour history of abdominal pain. The pain is localized in his right lower quadrant. He has not had nausea vomiting or diarrhea. On CT scan he was found to have a retrocecal appendicitis with a diameter of about 1.2 cm in his appendix with evidence of appendicitis.  He underwent appendectomy and had allot of pain which resolved with percocet. He complained of pain in the Right shoulder, film of chest and shoulder normal Plan D/C home today.  Pt. Speaks only Spanish, but his cousin can speak [place]. Follow up in 2 weeks.   Condition on D/C:  Improved  Disposition: Final discharge disposition not confirmed   Medication List  As of 06/17/2011  5:10 PM   STOP taking these medications         bismuth subsalicylate 262 MG/15ML suspension         TAKE these medications         acetaminophen 325 MG tablet   Commonly known as: TYLENOL   Take 2 tablets (650 mg total) by mouth every 6 (six) hours as needed for pain.      HYDROcodone-acetaminophen 5-325 MG per tablet   Commonly known as: NORCO   Take 1-2 tablets by mouth every 4 (four) hours as needed.      ibuprofen 200 MG tablet   Commonly known as: ADVIL,MOTRIN   Take 3 tablets (600 mg total) by mouth every 6 (six) hours as needed.           Follow-up Information    Follow up with Luretha Murphy B, MD. Schedule an appointment as soon as possible for a visit in 2 weeks.   Contact information:   [employer] [address],  [address] [place] [address] [phone]          Signed: Sherrie George 06/17/2011, 5:10 PM

## 2011-06-21 ENCOUNTER — Telehealth (INDEPENDENT_AMBULATORY_CARE_PROVIDER_SITE_OTHER): Payer: Self-pay | Admitting: General Surgery

## 2011-06-21 NOTE — Telephone Encounter (Signed)
Pt calling in for postop appt; needs ASAP so he can be released for work (concerned he will lose his job if out too long.)  Stated okay to leave a message.  Had lap appe last week.

## 2011-06-24 ENCOUNTER — Encounter (HOSPITAL_COMMUNITY): Payer: Self-pay | Admitting: Surgery

## 2011-07-01 ENCOUNTER — Ambulatory Visit (INDEPENDENT_AMBULATORY_CARE_PROVIDER_SITE_OTHER): Payer: Self-pay | Admitting: Surgery

## 2011-07-01 VITALS — BP 130/72 | HR 88 | Temp 97.4°F | Resp 16 | Ht 67.5 in | Wt 172.0 lb

## 2011-07-01 DIAGNOSIS — Z9889 Other specified postprocedural states: Secondary | ICD-10-CM

## 2011-07-01 DIAGNOSIS — Z9049 Acquired absence of other specified parts of digestive tract: Secondary | ICD-10-CM

## 2011-07-01 NOTE — Progress Notes (Signed)
Jeremiah Snyder 20 y.o.  Body mass index is 26.54 kg/(m^2).  Patient Active Problem List  Diagnoses  . Appendicitis, acute    No Known Allergies  Past Surgical History  Procedure Date  . Laparoscopic appendectomy 06/16/2011    Procedure: APPENDECTOMY LAPAROSCOPIC;  Surgeon: Valarie Merino, MD;  Location: [facility];  Service: General;  Laterality: N/A;   No primary provider on file. No diagnosis found.  The patient comes in today with an interpreter. I performed a laparoscopic appendectomy on him. His incisions are healed fine. He will be ready to work on Monday. I told him he could eat or what he wanted him to return to full activity. Return p.r.n. Matt B. Daphine Deutscher, MD, [facility] [phone] beeper [phone]  07/01/2011 6:02 PM

## 2012-11-10 ENCOUNTER — Encounter (HOSPITAL_COMMUNITY): Payer: Self-pay | Admitting: Emergency Medicine

## 2012-11-10 NOTE — ED Notes (Signed)
Pt laceration to right hand while at work.

## 2012-11-11 ENCOUNTER — Encounter (HOSPITAL_COMMUNITY): Payer: Self-pay | Admitting: *Deleted

## 2012-11-11 ENCOUNTER — Emergency Department (HOSPITAL_COMMUNITY)
Admission: EM | Admit: 2012-11-11 | Discharge: 2012-11-11 | Disposition: A | Discharge status: Home / Self Care | Payer: Self-pay | Attending: Emergency Medicine | Admitting: Emergency Medicine

## 2012-11-11 ENCOUNTER — Emergency Department (HOSPITAL_COMMUNITY): Payer: Self-pay

## 2012-11-11 DIAGNOSIS — Y929 Unspecified place or not applicable: Secondary | ICD-10-CM | POA: Insufficient documentation

## 2012-11-11 DIAGNOSIS — F430 Acute stress reaction: Secondary | ICD-10-CM | POA: Insufficient documentation

## 2012-11-11 DIAGNOSIS — S61409A Unspecified open wound of unspecified hand, initial encounter: Secondary | ICD-10-CM | POA: Insufficient documentation

## 2012-11-11 DIAGNOSIS — S61411A Laceration without foreign body of right hand, initial encounter: Secondary | ICD-10-CM

## 2012-11-11 DIAGNOSIS — W268XXA Contact with other sharp object(s), not elsewhere classified, initial encounter: Secondary | ICD-10-CM | POA: Insufficient documentation

## 2012-11-11 DIAGNOSIS — Z23 Encounter for immunization: Secondary | ICD-10-CM | POA: Insufficient documentation

## 2012-11-11 DIAGNOSIS — Y990 Civilian activity done for income or pay: Secondary | ICD-10-CM | POA: Insufficient documentation

## 2012-11-11 DIAGNOSIS — S61411D Laceration without foreign body of right hand, subsequent encounter: Secondary | ICD-10-CM

## 2012-11-11 DIAGNOSIS — Y939 Activity, unspecified: Secondary | ICD-10-CM | POA: Insufficient documentation

## 2012-11-11 DIAGNOSIS — M79609 Pain in unspecified limb: Secondary | ICD-10-CM | POA: Insufficient documentation

## 2012-11-11 MED ORDER — OXYCODONE-ACETAMINOPHEN 5-325 MG PO TABS: 2.0000 | ORAL_TABLET | Freq: Four times a day (QID) | ORAL | Status: AC | PRN

## 2012-11-11 MED ORDER — OXYCODONE-ACETAMINOPHEN 5-325 MG PO TABS
2.0000 | ORAL_TABLET | Freq: Once | ORAL | Status: AC
  Administered 2012-11-11: 2 via ORAL
  Filled 2012-11-11: qty 2

## 2012-11-11 MED ORDER — TETANUS-DIPHTH-ACELL PERTUSSIS 5-2.5-18.5 LF-MCG/0.5 IM SUSP
0.5000 mL | Freq: Once | INTRAMUSCULAR | Status: AC
  Administered 2012-11-11: 0.5 mL via INTRAMUSCULAR
  Filled 2012-11-11: qty 0.5

## 2012-11-11 NOTE — ED Notes (Signed)
Pt was seen here earlier for hand laceration he states the pain is too much so he needs pain medication

## 2012-11-11 NOTE — ED Provider Notes (Signed)
  CSN: [id number]     Arrival date & time 11/11/12  8295 History     First MD Initiated Contact with Patient 11/11/12 0534     Chief Complaint  Patient presents with  . Hand Pain   (Consider location/radiation/quality/duration/timing/severity/associated sxs/prior Treatment) HPI 22 yo male with hand laceration repaired by me earlier in the evening returns due to pain at the laceration site with some extension up into his arm.  No treatment at home prior to arrival.  No oozing at site, no swelling of hand noted.    History reviewed. No pertinent past medical history. Past Surgical History  Procedure Laterality Date  . Laparoscopic appendectomy  06/16/2011    Procedure: APPENDECTOMY LAPAROSCOPIC;  Surgeon: Valarie Merino, MD;  Location: [facility];  Service: General;  Laterality: N/A;   History reviewed. No pertinent family history. History  Substance Use Topics  . Smoking status: Never Smoker   . Smokeless tobacco: Never Used  . Alcohol Use: Yes     Comment: sOCIAL    Review of Systems  All other systems reviewed and are negative.    Allergies  Review of patient's allergies indicates no known allergies.  Home Medications   Current Outpatient Rx  Name  Route  Sig  Dispense  Refill  . oxyCODONE-acetaminophen (PERCOCET/ROXICET) 5-325 MG per tablet   Oral   Take 2 tablets by mouth every 6 (six) hours as needed for pain.   30 tablet   0    BP 168/69  Pulse 68  Temp(Src) 98.2 F (36.8 C) (Oral)  Resp 16  Ht 5\' 7"  (1.702 m)  Wt 165 lb (74.844 kg)  BMI 25.84 kg/m2  SpO2 98% Physical Exam  Constitutional: He appears well-developed and well-nourished. He appears distressed.  Musculoskeletal: Normal range of motion. He exhibits tenderness. He exhibits no edema.  Sutures are intact, no induration, no tension, compartment soft, normal ROM of hand though some pain with making fist.  Normal ROM of wrist, arm.  Pulses and sensation intact  Skin: Skin is warm and dry. No rash  noted. No erythema. No pallor.    ED Course   Procedures (including critical care time)  Labs Reviewed - No data to display Dg Hand Complete Right  11/11/2012   *RADIOLOGY REPORT*  Clinical Data: Hand laceration  RIGHT HAND - COMPLETE 3+ VIEW  Comparison: None.  Findings: Three views of the right hand demonstrate no acute fracture, malalignment or retained radiopaque foreign body. Irregularity of the palmar soft tissues consistent with the clinical history of laceration.  Mild irregularity of the fifth metacarpal suggests a remote healed fracture site.  Normal bony mineralization.  IMPRESSION: No acute fracture or retained radiopaque foreign body.   Original Report Authenticated By: Malachy Moan, M.D.   1. Laceration of hand, right, subsequent encounter     MDM  22 yo male with pain after laceration to right hand.  Will prescribe percocet.  Olivia Mackie, MD 11/11/12 1800

## 2012-11-11 NOTE — ED Notes (Signed)
Dr Norlene Campbell uncovered hand and examined for any changes, none noted this writer redressed hand

## 2012-11-11 NOTE — ED Notes (Signed)
Bed: ZO10 Expected date:  Expected time:  Means of arrival:  Comments: Pt in room - trying to register

## 2012-11-11 NOTE — ED Provider Notes (Signed)
CSN: [id number]     Arrival date & time 11/11/12  0001 History     First MD Initiated Contact with Patient 11/11/12 0002     Chief Complaint  Patient presents with  . Hand Injury   (Consider location/radiation/quality/duration/timing/severity/associated sxs/prior Treatment) HPI 22 yo male presents to the ER from work with complaint of laceration to right hand just prior to arrival.  Unknown last tetanus.  Pain with movement of pinky finger.  No other injuries or complaints. History reviewed. No pertinent past medical history. Past Surgical History  Procedure Laterality Date  . Laparoscopic appendectomy  06/16/2011    Procedure: APPENDECTOMY LAPAROSCOPIC;  Surgeon: Valarie Merino, MD;  Location: [facility];  Service: General;  Laterality: N/A;   History reviewed. No pertinent family history. History  Substance Use Topics  . Smoking status: Never Smoker   . Smokeless tobacco: Never Used  . Alcohol Use: Yes     Comment: sOCIAL    Review of Systems  All other systems reviewed and are negative.    Allergies  Review of patient's allergies indicates no known allergies.  Home Medications  No current outpatient prescriptions on file. BP 189/92  Pulse 79  Temp(Src) 98.8 F (37.1 C) (Oral)  Resp 12  SpO2 99% Physical Exam  Nursing note and vitals reviewed. Constitutional: He is oriented to person, place, and time. He appears well-developed and well-nourished. He appears distressed.  HENT:  Head: Normocephalic and atraumatic.  Nose: Nose normal.  Mouth/Throat: Oropharynx is clear and moist.  Eyes: Conjunctivae and EOM are normal. Pupils are equal, round, and reactive to light.  Neck: Normal range of motion. Neck supple. No JVD present. No tracheal deviation present. No thyromegaly present.  Cardiovascular: Normal rate, regular rhythm, normal heart sounds and intact distal pulses.  Exam reveals no gallop and no friction rub.   No murmur heard. Pulmonary/Chest: Effort normal and  breath sounds normal. No stridor. No respiratory distress. He has no wheezes. He has no rales. He exhibits no tenderness.  Musculoskeletal: He exhibits tenderness. He exhibits no edema.  2.5 cm laceration to palm of right hand at palmar crease laterally just proximal to 5th finger.  Wound explored, no involvement of tendon.  Sensation intact.  Normal ROM of fingers.  Normal cap refill  Lymphadenopathy:    He has no cervical adenopathy.  Neurological: He is alert and oriented to person, place, and time. He exhibits normal muscle tone. Coordination normal.  Skin: Skin is warm and dry. No rash noted. No erythema. No pallor.  Psychiatric: He has a normal mood and affect. His behavior is normal. Judgment and thought content normal.    ED Course   Procedures (including critical care time)  Labs Reviewed - No data to display Dg Hand Complete Right  11/11/2012   *RADIOLOGY REPORT*  Clinical Data: Hand laceration  RIGHT HAND - COMPLETE 3+ VIEW  Comparison: None.  Findings: Three views of the right hand demonstrate no acute fracture, malalignment or retained radiopaque foreign body. Irregularity of the palmar soft tissues consistent with the clinical history of laceration.  Mild irregularity of the fifth metacarpal suggests a remote healed fracture site.  Normal bony mineralization.  IMPRESSION: No acute fracture or retained radiopaque foreign body.   Original Report Authenticated By: Malachy Moan, M.D.   LACERATION REPAIR Performed by: Olivia Mackie Authorized by: Olivia Mackie Consent: Verbal consent obtained. Risks and benefits: risks, benefits and alternatives were discussed Consent given by: patient Patient identity confirmed: provided demographic data  Prepped and Draped in normal sterile fashion Wound explored  Laceration Location: right hand on palm  Laceration Length: 2.5cm  No Foreign Bodies seen or palpated.  No tendon involvement  Anesthesia: local infiltration  Local  anesthetic: lidocaine 2% with epinephrine  Anesthetic total: 4 ml  Irrigation method: syringe Amount of cleaning: extensive  Skin closure: 4.0 prolene  Number of sutures: 5  Technique: simple interrupted  Patient tolerance: Patient tolerated the procedure well with no immediate complications.   1. Laceration of right hand, initial encounter     MDM  22 yo male with right hand laceration.  Wound repaired, tetanus updated.  Given wound precautions.  Tylenol or motrin for pain  Olivia Mackie, MD 11/11/12 1806

## 2014-03-21 IMAGING — CT CT ABD-PELV W/ CM
1 of 2 series · 15 of 32 positions shown, 19 images · IV contrast (omnipaque)
Comparison: None.

CLINICAL DATA: Right sided abdominal pain, nausea and leukocytosis.

CT ABDOMEN AND PELVIS WITH CONTRAST
TECHNIQUE: Multidetector CT imaging of the abdomen and pelvis was
performed following the standard protocol during bolus
administration of intravenous contrast.
Contrast: 100mL OMNIPAQUE IOHEXOL 300 MG/ML IJ SOLN

[Series 2: abd/pel with · axial · 0.64mm/px · z∈[+1617,+2012]mm · 15 of 87 slices shown, 19 images]
[im 4/87  soft-tissue]
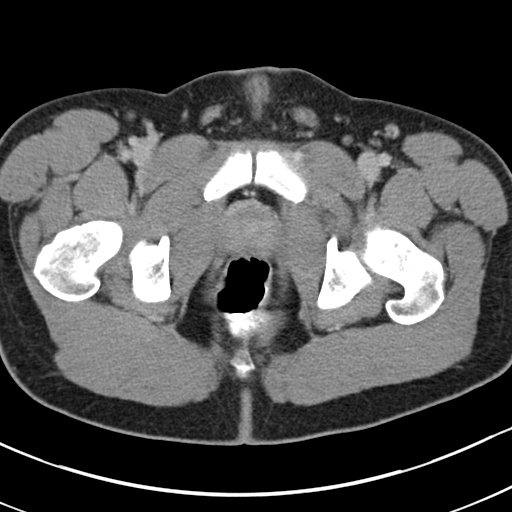
[im 4/87  bone]
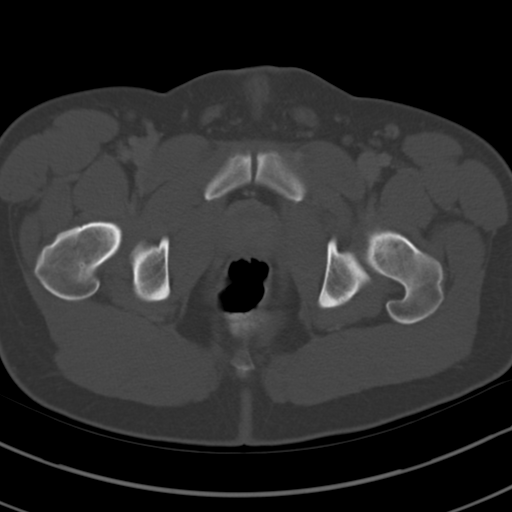
[im 10/87  soft-tissue]
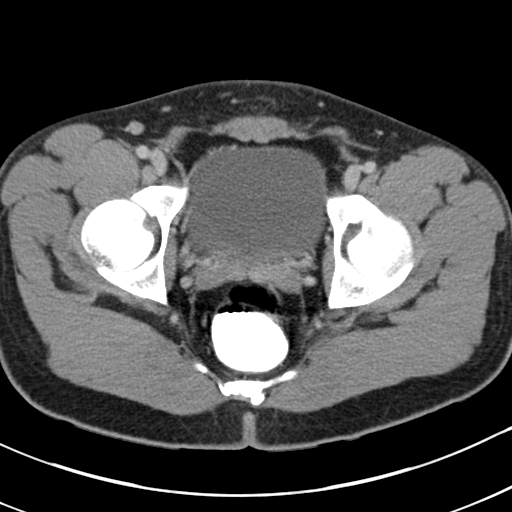
[im 17/87  soft-tissue]
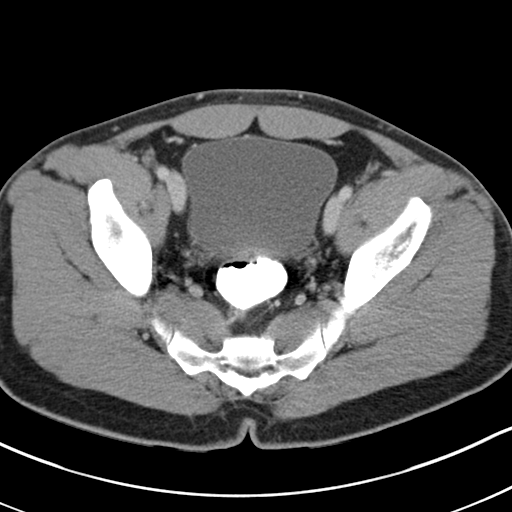
[im 24/87  soft-tissue]
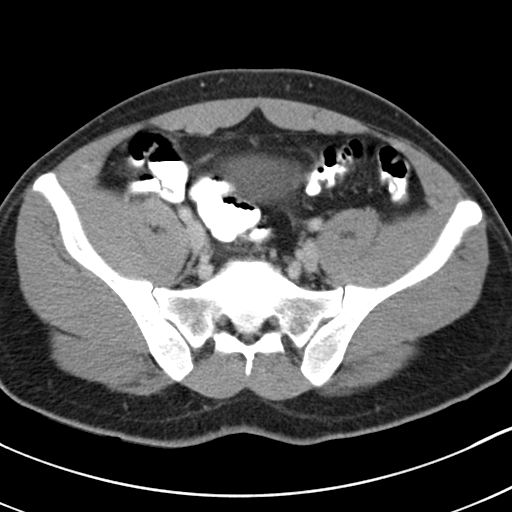
[im 30/87  soft-tissue]
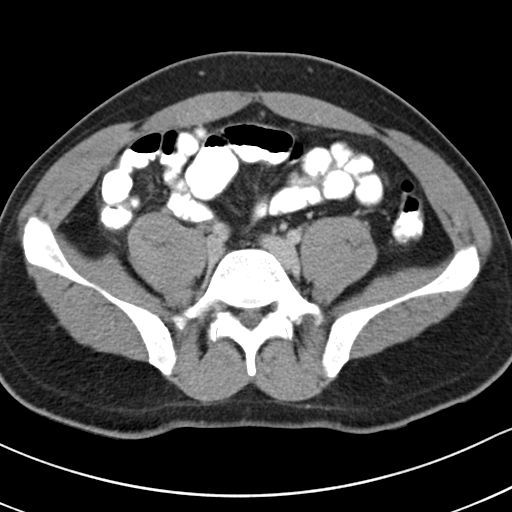
[im 37/87  soft-tissue]
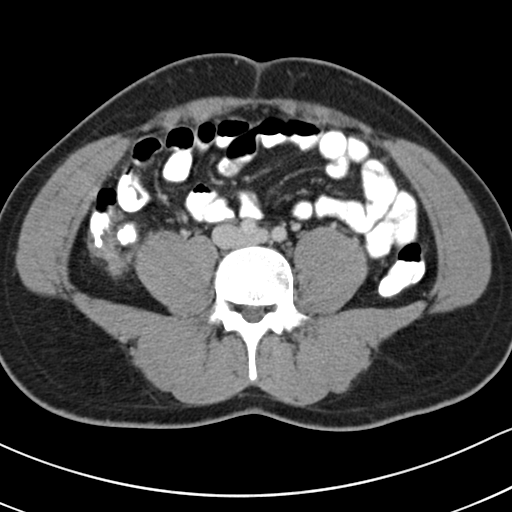
[im 44/87  soft-tissue]
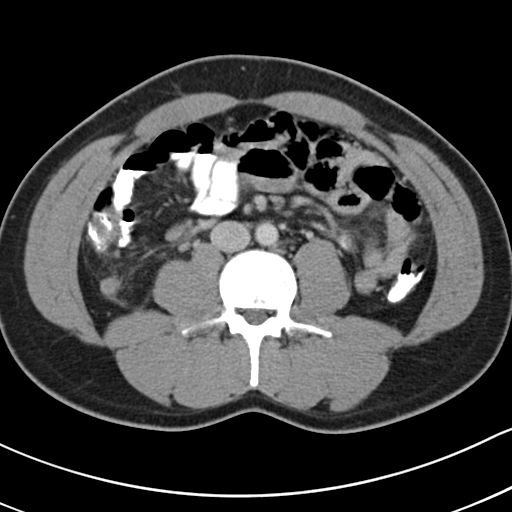
[im 50/87  soft-tissue]
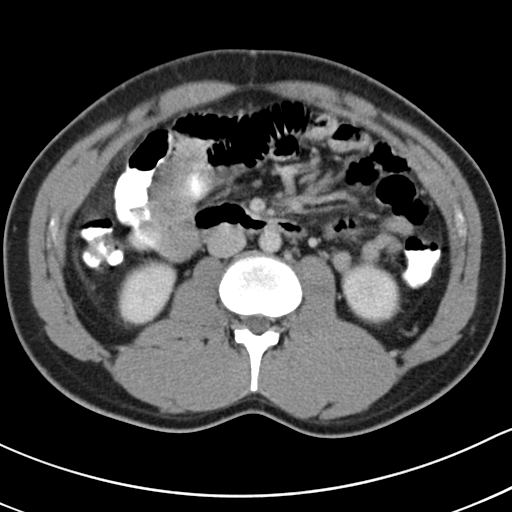
[im 57/87  soft-tissue]
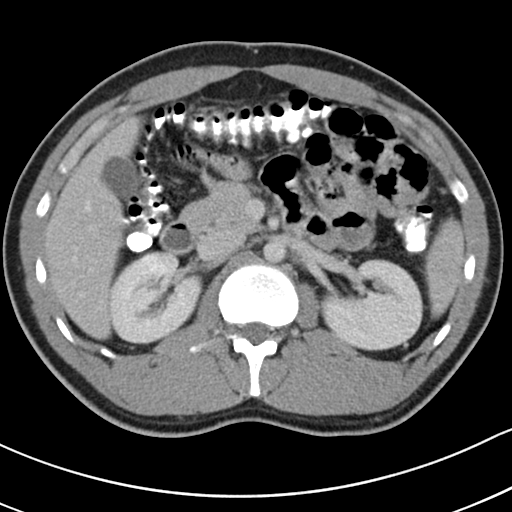
[im 57/87  bone]
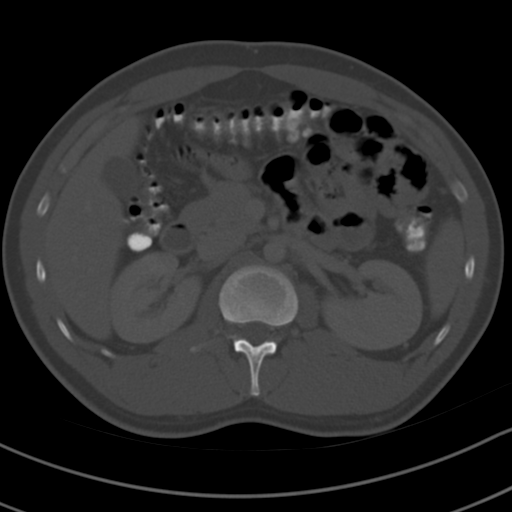
[im 63/87  soft-tissue]
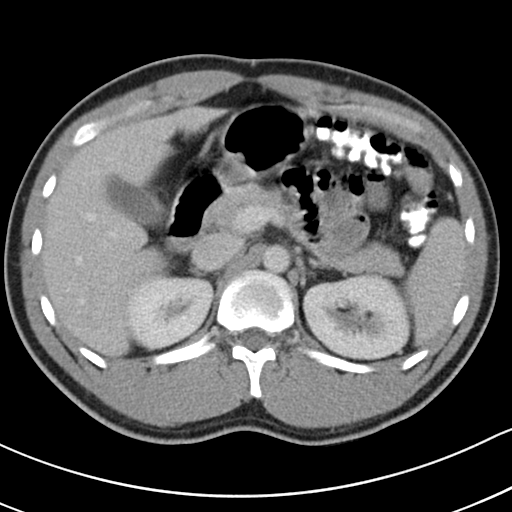
[im 70/87  soft-tissue]
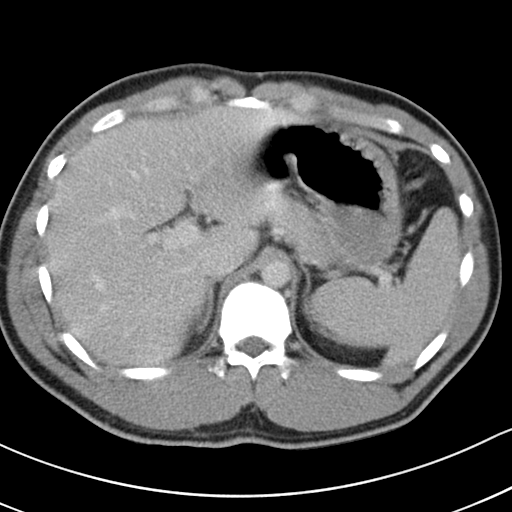
[im 73/87  lung]
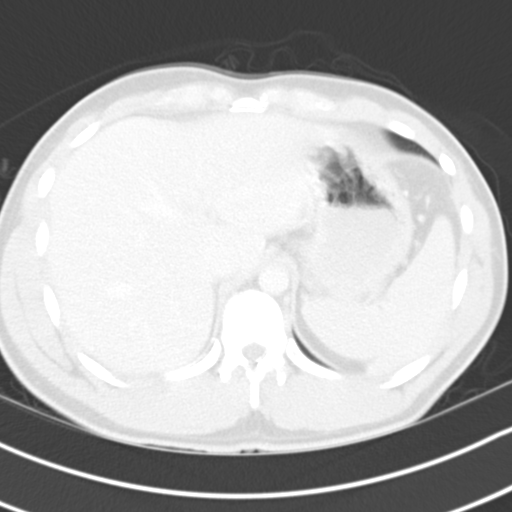
[im 77/87  soft-tissue]
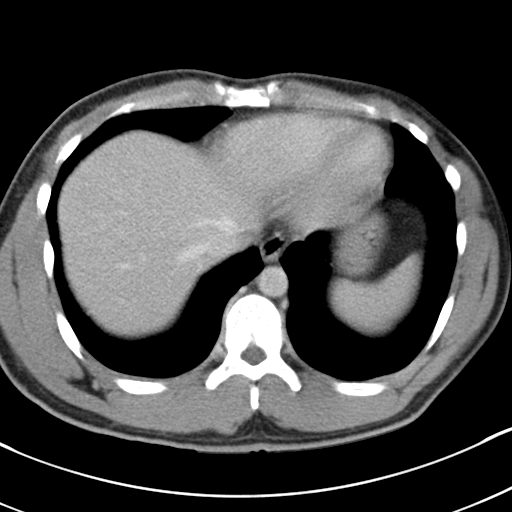
[im 77/87  lung]
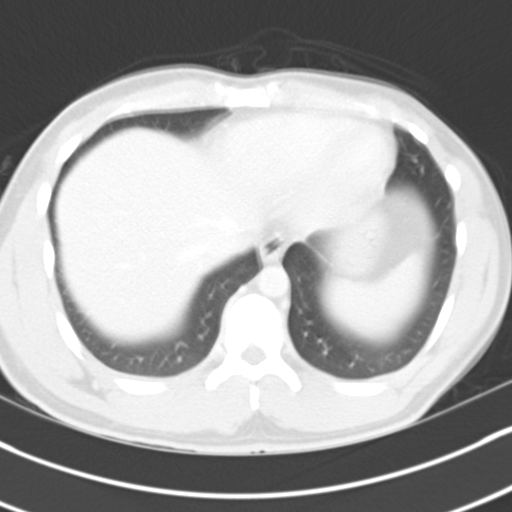
[im 80/87  lung]
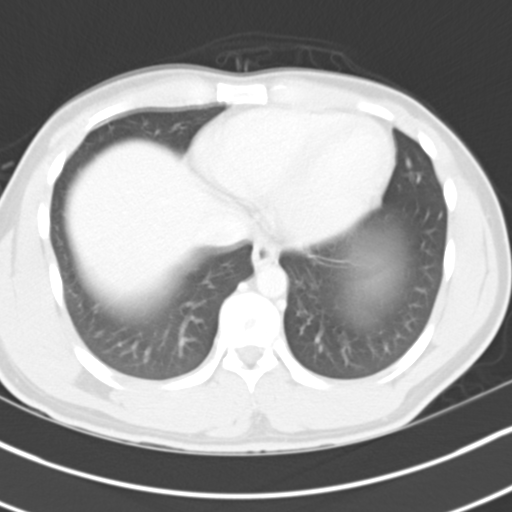
[im 83/87  soft-tissue]
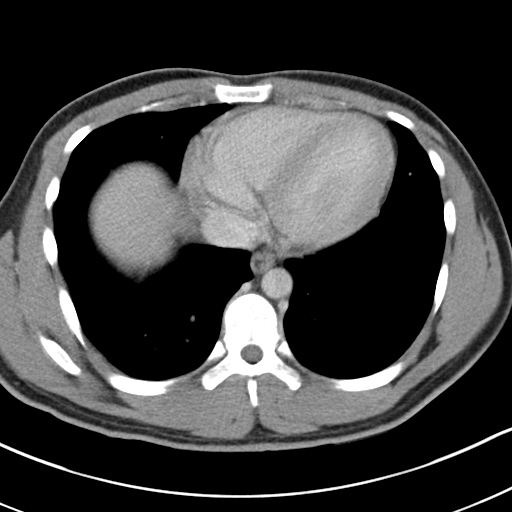
[im 83/87  lung]
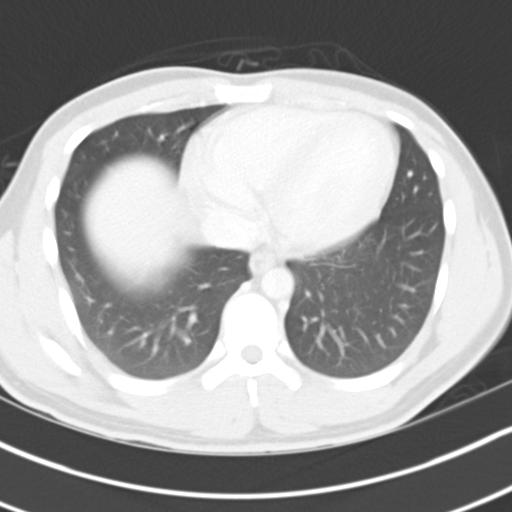

[15 of 32 positions shown; findings below may reference images not displayed]

FINDINGS: The visualized lung bases are clear.

The liver and spleen are unremarkable in appearance.  The
gallbladder is within normal limits.  The pancreas and adrenal
glands are unremarkable.

The kidneys are unremarkable in appearance.  There is no evidence
of hydronephrosis.  No renal or ureteral stones are seen.  No
perinephric stranding is appreciated.

No free fluid is identified.  The small bowel is unremarkable in
appearance.  The stomach is within normal limits.  No acute
vascular abnormalities are seen.

The appendix is diffusely dilated, measuring 1.2 cm in diameter,
with wall thickening, and surrounding soft tissue stranding and
trace fluid.  A retrocecal appendix is noted.  Associated prominent
pericecal lymph nodes are appreciated.  Findings compatible with
acute appendicitis.

The colon is filled with contrast and is unremarkable in
appearance.

The bladder is moderately distended and grossly unremarkable in
appearance.  A small urachal remnant is incidentally seen.  The
prostate remains normal in size.  No inguinal lymphadenopathy is
seen; inguinal nodes remain borderline normal in size.

No acute osseous abnormalities are identified.
IMPRESSION: Acute appendicitis noted, with dilatation of the appendix to
cm, wall thickening, and surrounding soft tissue stranding and
trace fluid.  Prominent pericecal nodes seen.  The appendix is
retrocecal in nature.  No evidence of perforation or abscess
formation at this time.

## 2019-09-15 ENCOUNTER — Other Ambulatory Visit: Payer: Self-pay

## 2019-09-15 ENCOUNTER — Emergency Department (HOSPITAL_COMMUNITY)
Admission: EM | Admit: 2019-09-15 | Discharge: 2019-09-15 | Disposition: A | Discharge status: Home / Self Care | Payer: 59 | Attending: Emergency Medicine | Admitting: Emergency Medicine

## 2019-09-15 DIAGNOSIS — R220 Localized swelling, mass and lump, head: Secondary | ICD-10-CM | POA: Diagnosis present

## 2019-09-15 DIAGNOSIS — L237 Allergic contact dermatitis due to plants, except food: Secondary | ICD-10-CM | POA: Diagnosis not present

## 2019-09-15 MED ORDER — DIPHENHYDRAMINE HCL 50 MG/ML IJ SOLN
25.0000 mg | Freq: Once | INTRAMUSCULAR | Status: AC
  Administered 2019-09-15: 25 mg via INTRAVENOUS
  Filled 2019-09-15: qty 1

## 2019-09-15 MED ORDER — DEXAMETHASONE SODIUM PHOSPHATE 10 MG/ML IJ SOLN
20.0000 mg | Freq: Once | INTRAMUSCULAR | Status: AC
  Administered 2019-09-15: 20 mg via INTRAVENOUS
  Filled 2019-09-15 (×2): qty 2

## 2019-09-15 MED ORDER — PREDNISONE 10 MG (21) PO TBPK
ORAL_TABLET | ORAL | 0 refills | Status: AC
Start: 2019-09-15 — End: ?

## 2019-09-15 MED ORDER — FAMOTIDINE IN NACL 20-0.9 MG/50ML-% IV SOLN
20.0000 mg | Freq: Once | INTRAVENOUS | Status: AC
  Administered 2019-09-15: 20 mg via INTRAVENOUS
  Filled 2019-09-15: qty 50

## 2019-09-15 NOTE — Discharge Instructions (Signed)
Usted fue visto hoy por hiedra venenosa, vea las instrucciones adjuntas a continuacin. Quiero que tome la prednisona segn lo prescrito. Si tiene algn sntoma nuevo o peor, quiero que regrese al departamento de Sports administrator. Puede usar Nurse, mental health de venta libre como ya comentamos, sin embargo, creo que su sarpullido desaparecer en los prximos das con la prednisona. l puede hacer un seguimiento con un mdico de atencin primaria en los prximos das para asegurarse de que su hinchazn facial se haya resuelto. Si tiene problemas para respirar o hinchazn de los labios, regrese al departamento de Sports administrator.  You were seen today for poison ivy, you see attached instructions below.  I want you to take the prednisone as prescribed.  If you have any new or worse concerning symptoms I want you to come back to the emergency department.  You can use over-the-counter antiitch creams as we discussed, however I think that your rash will clear up in the next couple of days with the prednisone.  He can follow-up with a primary care in the next couple of days to make sure that your facial swelling has resolved.  If you have trouble breathing or lip swelling please come back to the emergency department.

## 2019-09-15 NOTE — ED Provider Notes (Signed)
[facility]-EMERGENCY DEPT Provider Note   CSN: [id number] Arrival date & time: 09/15/19  0840     History No chief complaint on file.   Jeremiah Snyder is a 29 y.o. male with no pertinent past medical history that presents emergency department today for allergic reaction.  Patient is Spanish-speaking and medical interpreter was used.  Patient states that he was pulling weeds in his garden on Friday, states that he started to have a rash on his forearms and groin.  States that he rubbed his face and woke up the next day with eye swelling.  States that his eyes do not hurt, however are swollen.  States that something similar happened to him like this when he was in his yard 3 years ago but was only on his extremities.  States that the rash is extremely itchy.  Denies any pain anywhere.  Has tried topical Benadryl without any relief.  States that his eyes also feel extremely itchy, denies any vision changes.  Denies any headache.  Denies any chest pain, shortness of breath, dizziness, back pain, abdominal pain, nausea, vomiting.  Is otherwise pretty healthy.  Has been eating and drinking normally.  No drooling, trismus, voice changes, dysphagia, odynophagia, trouble breathing.  No other rash noted, hives, swelling of lips.  Denies wearing gloves.  Patient also admits to rash around his penis, states that he did scratch that area after touching weeds. No  penile discharge, no penile lesions, no penile pain. HPI     No past medical history on file.  Patient Active Problem List   Diagnosis Date Noted  . Appendicitis, acute 06/16/2011    Past Surgical History:  Procedure Laterality Date  . LAPAROSCOPIC APPENDECTOMY  06/16/2011   Procedure: APPENDECTOMY LAPAROSCOPIC;  Surgeon: Valarie Merino, MD;  Location: [facility];  Service: General;  Laterality: N/A;       No family history on file.  Social History   Tobacco Use  . Smoking status: Never Smoker  . Smokeless  tobacco: Never Used  Substance Use Topics  . Alcohol use: Yes    Comment: sOCIAL  . Drug use: No    Home Medications Prior to Admission medications   Medication Sig Start Date End Date Taking? Authorizing Provider  oxyCODONE-acetaminophen (PERCOCET/ROXICET) 5-325 MG per tablet Take 2 tablets by mouth every 6 (six) hours as needed for pain. 11/11/12   Marisa Severin, MD  predniSONE (STERAPRED UNI-PAK 21 TAB) 10 MG (21) TBPK tablet Take 6 tabs by mouth daily  for 2 days, then 5 tabs for 2 days, then 4 tabs for 2 days, then 3 tabs for 2 days, 2 tabs for 2 days, then 1 tab by mouth daily for 2 days 09/15/19   Farrel Gordon, PA-C    Allergies    Patient has no known allergies.  Review of Systems   Review of Systems  Constitutional: Negative for chills, diaphoresis, fatigue and fever.  HENT: Positive for facial swelling. Negative for congestion, sore throat and trouble swallowing.   Eyes: Negative for pain and visual disturbance.  Respiratory: Negative for cough, shortness of breath and wheezing.   Cardiovascular: Negative for chest pain, palpitations and leg swelling.  Gastrointestinal: Negative for abdominal distention, abdominal pain, diarrhea, nausea and vomiting.  Genitourinary: Negative for difficulty urinating.  Musculoskeletal: Negative for back pain, neck pain and neck stiffness.  Skin: Positive for rash. Negative for pallor.  Neurological: Negative for dizziness, speech difficulty, weakness and headaches.  Psychiatric/Behavioral: Negative for confusion.  Physical Exam Updated Vital Signs BP (!) 139/92 (BP Location: Left Arm)   Pulse 74   Temp 98.3 F (36.8 C) (Oral)   Resp 18   Ht 5\' 6"  (1.676 m)   Wt 83.9 kg   SpO2 100%   BMI 29.86 kg/m   Physical Exam Exam conducted with a chaperone present.  Constitutional:      General: He is not in acute distress.    Appearance: Normal appearance. He is not ill-appearing, toxic-appearing or diaphoretic.  HENT:     Head:  Normocephalic and atraumatic. No raccoon eyes, Battle's sign, abrasion, contusion or masses.     Jaw: There is normal jaw occlusion. No tenderness, pain on movement or malocclusion.     Comments: Facial swelling only noticed around eyes, more right eye than left eye.  Right eye is completely shut, was able to invert eyelids and see entire eye which showed  No gross abnormalities.  Patient tolerated this well, denies any pain anywhere.  No rashes to eyes, only swelling.  No erythema or induration.  No signs of infection.  When I was able to invert eyelids, patient was able to see.  Mild swelling noted over left eye, not as bad as right eye.  Left eye is not swollen shut.  No swelling or angioedema of lips or tongue.  Patient is handling secretions well is able to swallow.  Patient is able to speak to me in full sentences without any airway compromise.    Mouth/Throat:     Mouth: Mucous membranes are moist.     Pharynx: Oropharynx is clear.  Eyes:     General: No scleral icterus.    Extraocular Movements: Extraocular movements intact.     Pupils: Pupils are equal, round, and reactive to light.  Cardiovascular:     Rate and Rhythm: Normal rate and regular rhythm.     Pulses: Normal pulses.     Heart sounds: Normal heart sounds.  Pulmonary:     Effort: Pulmonary effort is normal. No respiratory distress.     Breath sounds: Normal breath sounds. No stridor. No wheezing, rhonchi or rales.  Chest:     Chest wall: No tenderness.  Abdominal:     General: Abdomen is flat. There is no distension.     Palpations: Abdomen is soft.     Tenderness: There is no abdominal tenderness. There is no guarding or rebound.  Musculoskeletal:        General: No swelling or tenderness. Normal range of motion.     Cervical back: Normal range of motion and neck supple. No rigidity.     Right lower leg: No edema.     Left lower leg: No edema.  Skin:    General: Skin is warm and dry.     Capillary Refill: Capillary  refill takes less than 2 seconds.     Coloration: Skin is not pale.     Comments: Linear vesicular rash consistent with poison ivy on bilateral forearms, small rash also noticed on groin and tip of penis, rash also noticed on left knee.  Patient admits to scratching all these areas after touching weeds.  Neurological:     General: No focal deficit present.     Mental Status: He is alert and oriented to person, place, and time.  Psychiatric:        Mood and Affect: Mood normal.        Behavior: Behavior normal.     ED Results / Procedures /  Treatments   Labs (all labs ordered are listed, but only abnormal results are displayed) Labs Reviewed - No data to display  EKG None  Radiology No results found.  Procedures Procedures (including critical care time)  Medications Ordered in ED Medications  dexamethasone (DECADRON) injection 20 mg (20 mg Intravenous Given 09/15/19 1032)  diphenhydrAMINE (BENADRYL) injection 25 mg (25 mg Intravenous Given 09/15/19 1032)  famotidine (PEPCID) IVPB 20 mg premix (0 mg Intravenous Stopped 09/15/19 1127)    ED Course  I have reviewed the triage vital signs and the nursing notes.  Pertinent labs & imaging results that were available during my care of the patient were reviewed by me and considered in my medical decision making (see chart for details).    MDM Rules/Calculators/A&P                         Jeremiah Snyder is a 29 y.o. male with no pertinent past medical history that presents emergency department today for allergic reaction. Rash consistent with poison ivy, patient did touch eyes after handling weeds.  Will give Decadron, Benadryl and Pepcid and reassess.  Upon two hour  assessment, swelling has greatly improved, right eye is not swollen shut anymore and pt is able to see without any difficulty. Pt admits to feeling better and less itchy and swollen. Patient still without any respiratory compromise.   Pt has now been in the ER for  about 5 hours total and swelling began on Friday, swelling is improving. It is safe for pt to be discharged. Will discharge on prednisone taper..Doubt need for further emergent work up at this time. I explained the diagnosis and have given explicit precautions to return to the ER including for any other new or worsening symptoms. The patient understands and accepts the medical plan as it's been dictated and I have answered their questions. Discharge instructions concerning home care and prescriptions have been given. The patient is STABLE and is discharged to home in good condition.  I discussed this case with my attending physician who cosigned this note including patient's presenting symptoms, physical exam, and planned diagnostics and interventions. Attending physician stated agreement with plan or made changes to plan which were implemented.       Final Clinical Impression(s) / ED Diagnoses Final diagnoses:  Poison ivy dermatitis    Rx / DC Orders ED Discharge Orders         Ordered    predniSONE (STERAPRED UNI-PAK 21 TAB) 10 MG (21) TBPK tablet     Discontinue  Reprint     09/15/19 1257           Alfredia Client, PA-C 09/15/19 1807    Malvin Johns, MD 09/18/19 [phone]

## 2019-09-15 NOTE — ED Triage Notes (Addendum)
Per patient, he was pulling weeds in his yard Friday. On Saturday patient had a reaction with swelling/itching., patient presents to triage with facial swelling. Patient has taken benadryl and applied topical cream with no relief. Patient denies pain. Patient states he had a reaction like this three years ago but it was limited to extremities only. Patient denies any difficulty breathing or throat constriction.
